# Patient Record
Sex: Female | Born: 1995
Health system: Western US, Academic
[De-identification: ages and names within clinical notes are randomized; demographics above are authoritative.]

## PROBLEM LIST (undated history)

## (undated) DIAGNOSIS — G43909 Migraine, unspecified, not intractable, without status migrainosus: Secondary | ICD-10-CM

---
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
## 2015-11-17 IMAGING — CT CT HEAD W/O CM
2 series · 16 of 30 positions shown, 20 images · non-contrast
Comparison: 04/24/2012

CLINICAL DATA: Initial evaluation for migraine headache for 1 day,
with right eye pain in generalized body numbness

EXAM:
CT HEAD WITHOUT CONTRAST
TECHNIQUE: Contiguous axial images were obtained from the base of the skull
through the vertex without intravenous contrast.

[Series 201: head w/o, idose (1) · axial · non-contrast · 0.49mm/px · z∈[+212,+342]mm · 13 of 32 slices shown, 17 images]
[im 3/32  brain]
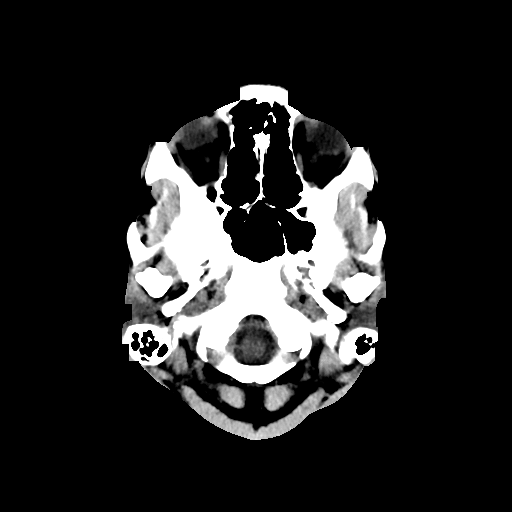
[im 3/32  bone]
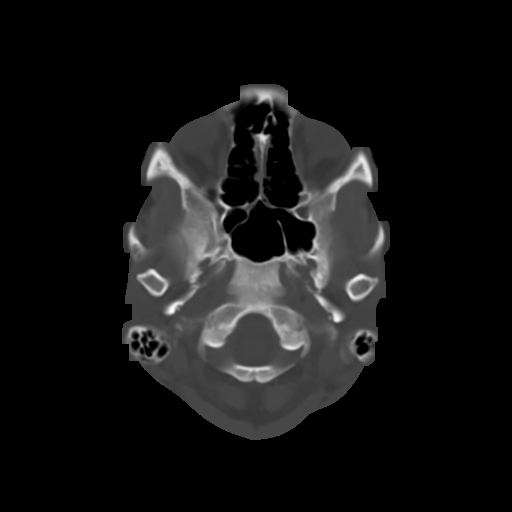
[im 5/32  brain]
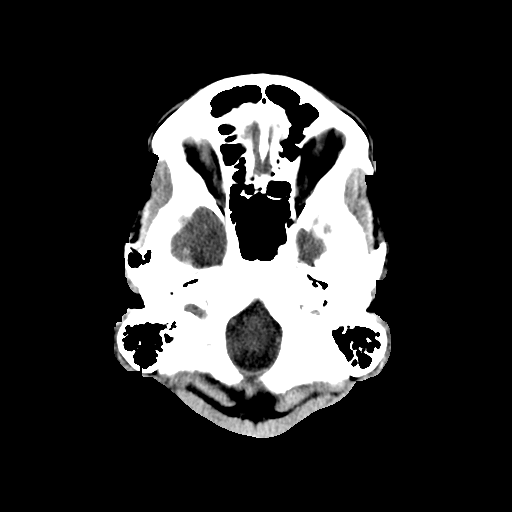
[im 7/32  brain]
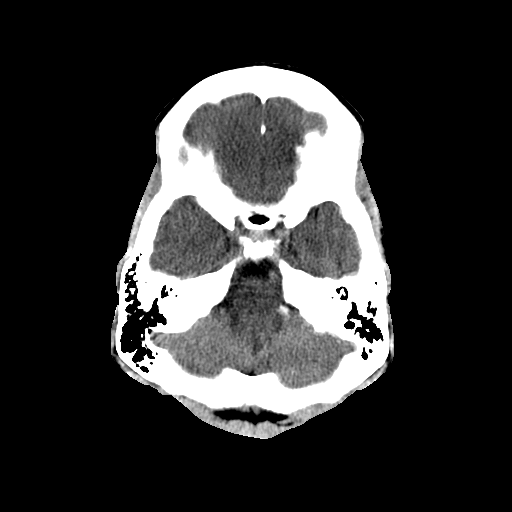
[im 9/32  brain]
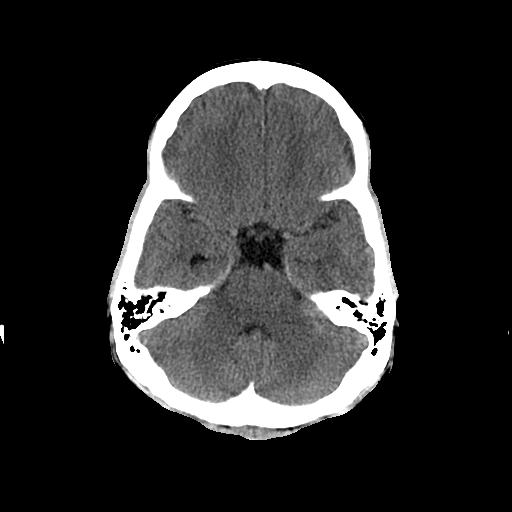
[im 12/32  brain]
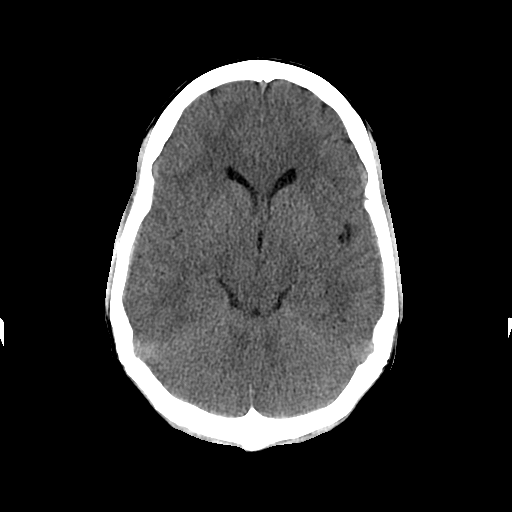
[im 12/32  bone]
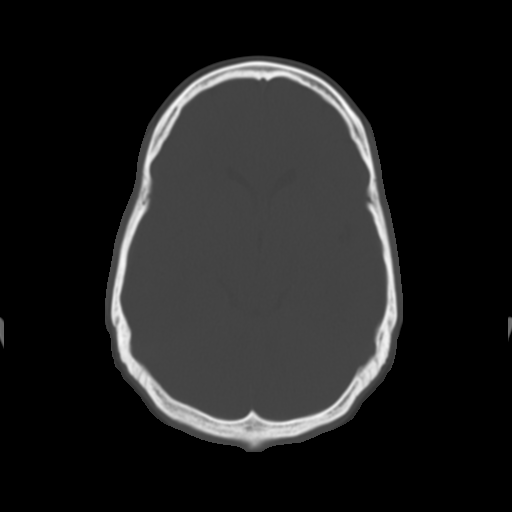
[im 14/32  brain]
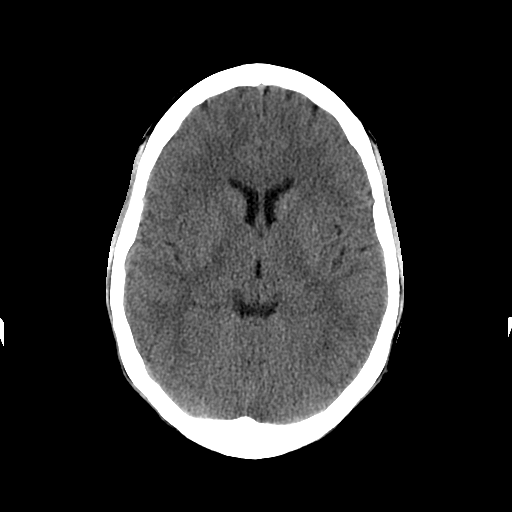
[im 16/32  brain]
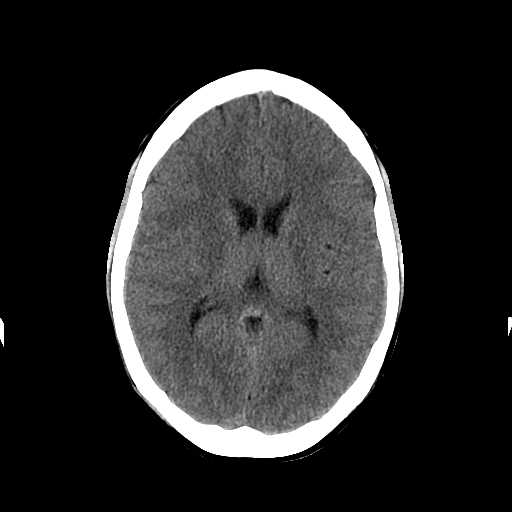
[im 18/32  brain]
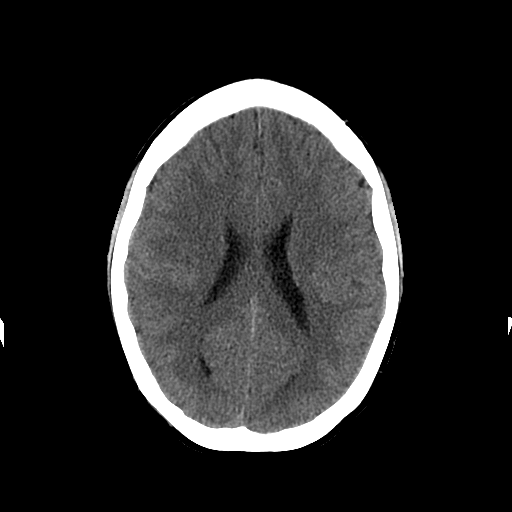
[im 20/32  brain]
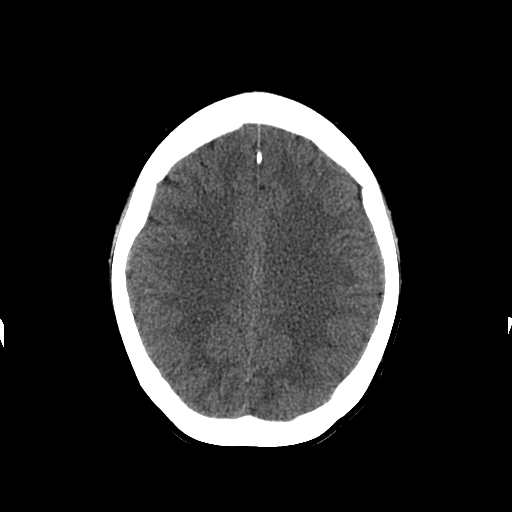
[im 20/32  bone]
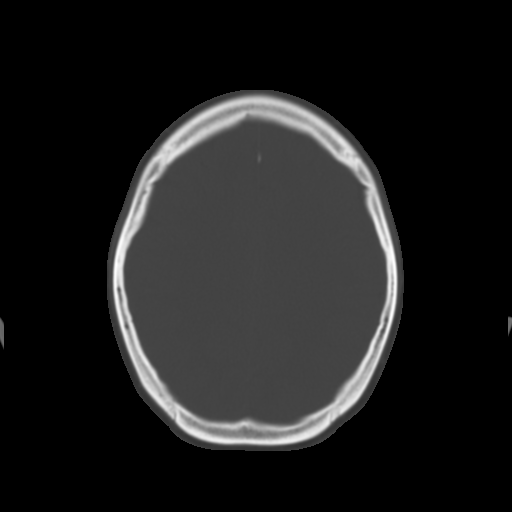
[im 23/32  brain]
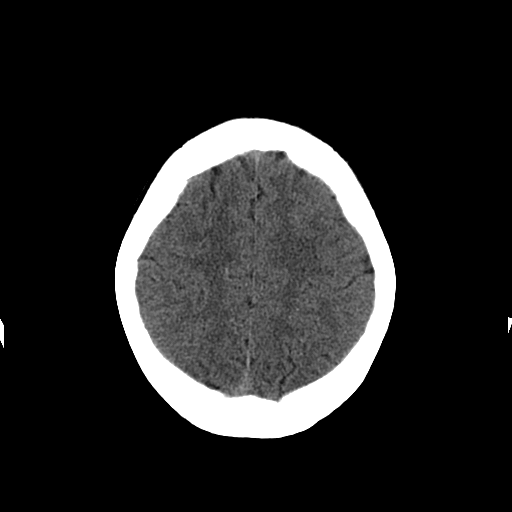
[im 25/32  brain]
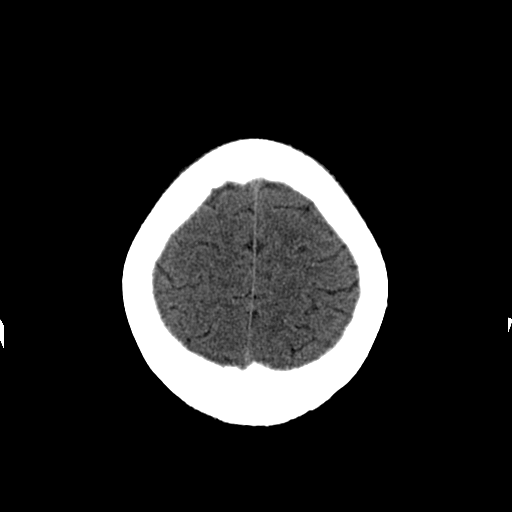
[im 27/32  brain]
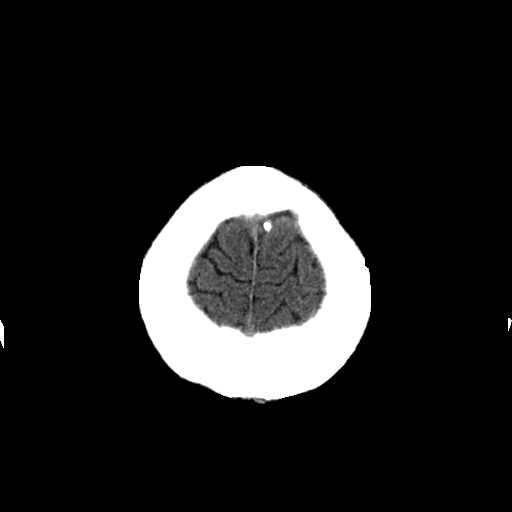
[im 29/32  brain]
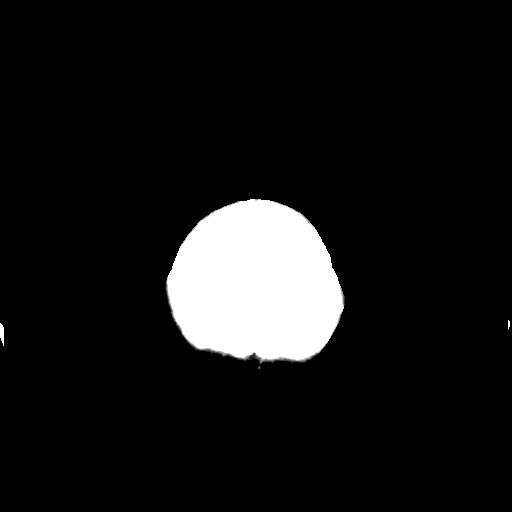
[im 29/32  bone]
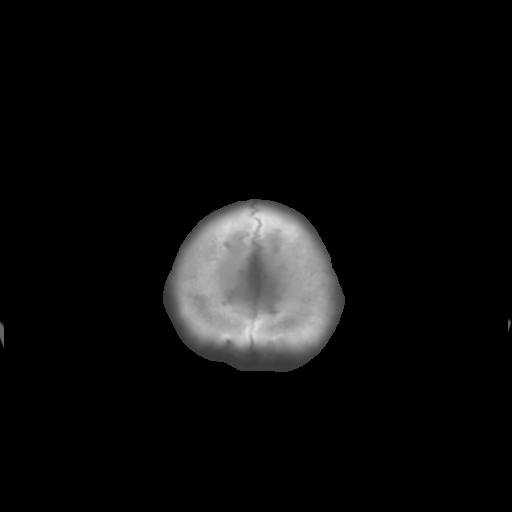

[Series 202: head w/o bone, idose (1) · axial · non-contrast · 0.49mm/px · z∈[+212,+257]mm · 3 of 32 slices shown]
[im 3/32  bone]
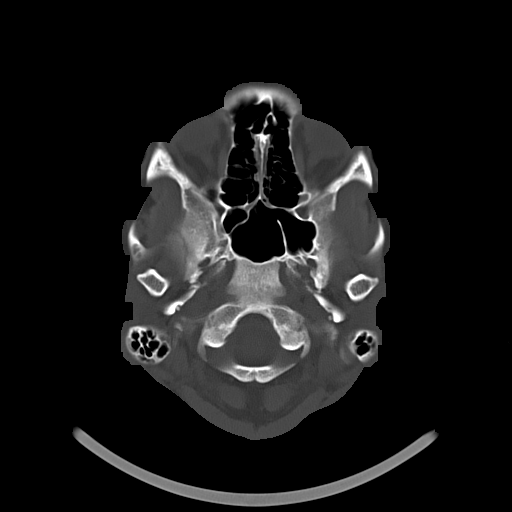
[im 7/32  bone]
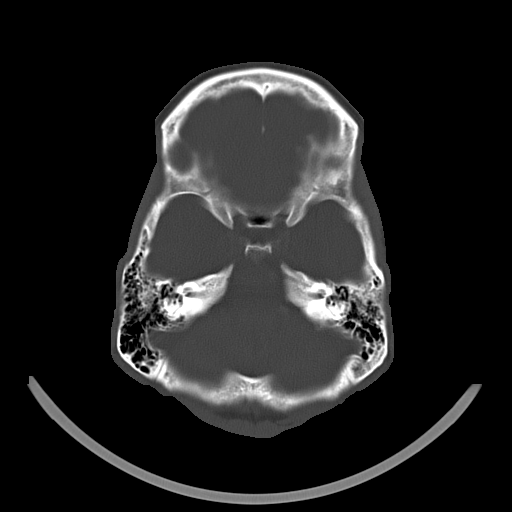
[im 12/32  bone]
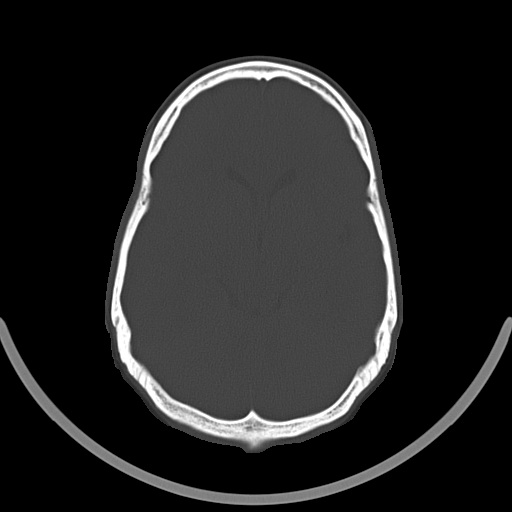

[16 of 30 positions shown; findings below may reference images not displayed]

FINDINGS: No mass lesion. No midline shift. No acute hemorrhage or hematoma.
No extra-axial fluid collections. No evidence of acute infarction.
Calvarium is intact. No significant inflammatory change in the
visualized portions of the paranasal sinuses.
IMPRESSION: Stable and negative head CT.

## 2017-03-03 ENCOUNTER — Encounter (HOSPITAL_COMMUNITY): Payer: Self-pay | Admitting: Emergency Medicine

## 2017-03-03 ENCOUNTER — Ambulatory Visit (HOSPITAL_COMMUNITY)
Admission: EM | Admit: 2017-03-03 | Discharge: 2017-03-03 | Disposition: A | Discharge status: Home / Self Care | Payer: BLUE CROSS/BLUE SHIELD | Attending: Internal Medicine | Admitting: Internal Medicine

## 2017-03-03 DIAGNOSIS — Z79899 Other long term (current) drug therapy: Secondary | ICD-10-CM | POA: Insufficient documentation

## 2017-03-03 DIAGNOSIS — R300 Dysuria: Secondary | ICD-10-CM | POA: Insufficient documentation

## 2017-03-03 LAB — POCT URINALYSIS DIP (DEVICE)
BILIRUBIN URINE: NEGATIVE
GLUCOSE, UA: NEGATIVE mg/dL
LEUKOCYTES UA: NEGATIVE
Nitrite: NEGATIVE
Protein, ur: NEGATIVE mg/dL
SPECIFIC GRAVITY, URINE: 1.025 (ref 1.005–1.030)
Urobilinogen, UA: 0.2 mg/dL (ref 0.0–1.0)
pH: 7 (ref 5.0–8.0)

## 2017-03-03 LAB — POCT PREGNANCY, URINE: Preg Test, Ur: NEGATIVE

## 2017-03-03 MED ORDER — METRONIDAZOLE 500 MG PO TABS: 500.0000 mg | ORAL_TABLET | Freq: Two times a day (BID) | ORAL | 0 refills | Status: DC

## 2017-03-03 MED ORDER — FLUCONAZOLE 150 MG PO TABS: 150.0000 mg | ORAL_TABLET | Freq: Every day | ORAL | 0 refills | Status: DC

## 2017-03-03 NOTE — Discharge Instructions (Signed)
Urine negative for UTI/pregnancy. Will send urine for culture for definite testing. You were treated empirically for bacterial vaginitis. Take flagyl as directed.  Refrain from alcohol use while on Flagyl.  I have also called in Diflucan for yeast infection given antibiotic use.  Cytology sent, you will be contacted with any positive results that requires further treatment. Refrain from sexual activity for the next 7 days. Monitor for any worsening of symptoms, fever, abdominal pain, nausea, vomiting, to follow up for reevaluation.

## 2017-03-03 NOTE — ED Provider Notes (Signed)
[facility]    CSN: [id number] Arrival date & time: 03/03/17  1744     History   Chief Complaint Chief Complaint  Patient presents with  . Urinary Tract Infection    HPI Kim Francis is a 22 y.o. female.   22 year old female comes in for 2-day history of foul urine odor and dysuria.  States she has a change in diet as she has been drinking more caffeine and alcohol. Denies vaginal discharge, itching/pain. She does endorse increased odor to the vaginal area as well.  States the symptoms are similar to when she had BV.  States has had some abdominal cramping that is intermittent after recent Nexplanon implant.  She is also coming off of her cycle, which is the first one in awhile as she had an old nexplanon.  Denies nausea, vomiting.  Denies fever, chills, night sweats. Sexually active with one partner without condom use.      Past Medical History:  Diagnosis Date  . Migraine     There are no active problems to display for this patient.   History reviewed. No pertinent surgical history.  OB History    No data available       Home Medications    Prior to Admission medications   Medication Sig Start Date End Date Taking? Authorizing Provider  fluconazole (DIFLUCAN) 150 MG tablet Take 1 tablet (150 mg total) by mouth daily. Take second dose 72 hours later if symptoms still persists. 03/03/17   Cathie Hoops, Amy V, PA-C  guaifenesin (CHEST CONGESTION RELIEF) 400 MG TABS tablet Take 400 mg by mouth every 6 (six) hours as needed (for congestion).    [provider]  metroNIDAZOLE (FLAGYL) 500 MG tablet Take 1 tablet (500 mg total) by mouth 2 (two) times daily. 03/03/17   Belinda Fisher, PA-C    Family History History reviewed. No pertinent family history.  Social History Social History   Tobacco Use  . Smoking status: Never Smoker  . Smokeless tobacco: Never Used  Substance Use Topics  . Alcohol use: Yes    Comment: occasionally  . Drug use: No      Allergies   Patient has no known allergies.   Review of Systems Review of Systems  Reason unable to perform ROS: See HPI as above.     Physical Exam Triage Vital Signs ED Triage Vitals [03/03/17 1922]  Enc Vitals Group     BP (!) 121/59     Pulse Rate 70     Resp 16     Temp 98.3 F (36.8 C)     Temp Source Oral     SpO2 100 %     Weight      Height      Head Circumference      Peak Flow      Pain Score      Pain Loc      Pain Edu?      Excl. in GC?    No data found.  Updated Vital Signs BP (!) 121/59 (BP Location: Left Arm)   Pulse 70   Temp 98.3 F (36.8 C) (Oral)   Resp 16   LMP 02/24/2017   SpO2 100%   Physical Exam  Constitutional: She is oriented to person, place, and time. She appears well-developed and well-nourished. No distress.  Eyes: Conjunctivae are normal. Pupils are equal, round, and reactive to light.  Cardiovascular: Normal rate, regular rhythm and normal heart sounds. Exam reveals no  gallop and no friction rub.  No murmur heard. Pulmonary/Chest: Effort normal and breath sounds normal. She has no wheezes. She has no rales.  Abdominal: Soft. Bowel sounds are normal. She exhibits no distension. There is no tenderness. There is no rebound, no guarding and no CVA tenderness.  Neurological: She is alert and oriented to person, place, and time.  Skin: Skin is warm and dry.     UC Treatments / Results  Labs (all labs ordered are listed, but only abnormal results are displayed) Labs Reviewed  POCT URINALYSIS DIP (DEVICE) - Abnormal; Notable for the following components:      Result Value   Ketones, ur TRACE (*)    Hgb urine dipstick TRACE (*)    All other components within normal limits  URINE CULTURE  POCT PREGNANCY, URINE  CERVICOVAGINAL ANCILLARY ONLY    EKG  EKG Interpretation None       Radiology No results found.  Procedures Procedures (including critical care time)  Medications Ordered in UC Medications - No data  to display   Initial Impression / Assessment and Plan / UC Course  I have reviewed the triage vital signs and the nursing notes.  Pertinent labs & imaging results that were available during my care of the patient were reviewed by me and considered in my medical decision making (see chart for details).    Urine without leukocytes or nitrates.  Will send for urine culture given patient's symptoms. Patient was treated empirically for BV. Start flagyl as directed.  Referral from alcohol use while on Flagyl.  Patient has a history of yeast infection with antibiotics, Diflucan called in.. Cytology sent, patient will be contacted with any positive results that require additional treatment. Patient to refrain from sexual activity for the next 7 days. Return precautions given.    Final Clinical Impressions(s) / UC Diagnoses   Final diagnoses:  Dysuria    ED Discharge Orders        Ordered    metroNIDAZOLE (FLAGYL) 500 MG tablet  2 times daily     03/03/17 2027    fluconazole (DIFLUCAN) 150 MG tablet  Daily     03/03/17 2027        Belinda FisherYu, Amy V, PA-C 03/03/17 2034

## 2017-03-03 NOTE — ED Triage Notes (Signed)
PT C/O: UTI sx onset 2 days associated w/foul urine odor and dysuria   Sexually active in a monogamous relationship w/no condom use.   No concerns for STDs  DENIES: fevers, vomiting, nausea, abd pain, vag d/c  TAKING MEDS: Azo    A&O x4... NAD... Ambulatory

## 2017-03-05 LAB — URINE CULTURE

## 2017-03-06 LAB — CERVICOVAGINAL ANCILLARY ONLY
Bacterial vaginitis: POSITIVE — AB
Candida vaginitis: NEGATIVE
Chlamydia: NEGATIVE
Neisseria Gonorrhea: NEGATIVE
Trichomonas: NEGATIVE

## 2017-04-26 ENCOUNTER — Ambulatory Visit (HOSPITAL_COMMUNITY)
Admission: EM | Admit: 2017-04-26 | Discharge: 2017-04-26 | Disposition: A | Discharge status: Home / Self Care | Payer: BLUE CROSS/BLUE SHIELD | Attending: Family Medicine | Admitting: Family Medicine

## 2017-04-26 ENCOUNTER — Encounter (HOSPITAL_COMMUNITY): Payer: Self-pay | Admitting: Emergency Medicine

## 2017-04-26 DIAGNOSIS — G43909 Migraine, unspecified, not intractable, without status migrainosus: Secondary | ICD-10-CM | POA: Insufficient documentation

## 2017-04-26 DIAGNOSIS — R300 Dysuria: Secondary | ICD-10-CM | POA: Diagnosis not present

## 2017-04-26 DIAGNOSIS — M549 Dorsalgia, unspecified: Secondary | ICD-10-CM | POA: Diagnosis not present

## 2017-04-26 DIAGNOSIS — Z3202 Encounter for pregnancy test, result negative: Secondary | ICD-10-CM | POA: Diagnosis not present

## 2017-04-26 LAB — POCT URINALYSIS DIP (DEVICE)
BILIRUBIN URINE: NEGATIVE
GLUCOSE, UA: NEGATIVE mg/dL
KETONES UR: NEGATIVE mg/dL
Nitrite: NEGATIVE
Protein, ur: 30 mg/dL — AB
Specific Gravity, Urine: 1.015 (ref 1.005–1.030)
Urobilinogen, UA: 0.2 mg/dL (ref 0.0–1.0)
pH: 7.5 (ref 5.0–8.0)

## 2017-04-26 LAB — POCT PREGNANCY, URINE: Preg Test, Ur: NEGATIVE

## 2017-04-26 MED ORDER — FLUCONAZOLE 150 MG PO TABS: 150.0000 mg | ORAL_TABLET | Freq: Once | ORAL | 0 refills | Status: AC

## 2017-04-26 MED ORDER — NITROFURANTOIN MONOHYD MACRO 100 MG PO CAPS: 100.0000 mg | ORAL_CAPSULE | Freq: Two times a day (BID) | ORAL | 0 refills | Status: AC

## 2017-04-26 NOTE — Discharge Instructions (Signed)
Urine showed evidence of infection. We are treating you with Macrobid. Be sure to take full course. Stay hydrated- urine should be pale yellow to clear. My continue azo for relief of burning while infection is being cleared.  ° °Please return or follow up with your primary provider if symptoms not improving with treatment. Please return sooner if you have worsening of symptoms or develop fever, nausea, vomiting, abdominal pain, back pain, lightheadedness, dizziness. ° °

## 2017-04-26 NOTE — ED Triage Notes (Signed)
Pt c/o dysuria x3 days, now c/o L flank pain. States shes been using azo.

## 2017-04-26 NOTE — ED Provider Notes (Signed)
[facility]    CSN: [id number] Arrival date & time: 04/26/17  1258     History   Chief Complaint Chief Complaint  Patient presents with  . Dysuria    HPI Kim Francis is a 22 y.o. female Patient is presenting with symptoms of dysuria, increased frequency, incomplete voiding, abdominal pressure. Denies hematuria, vaginal discharge. Denies fever, nausea, vomiting, .  Is endorsing some back pain. Patient had spotting last week, patient is on Nexplanon.  Has typical spotting as her menstrual cycle.  Taking Azo.   HPI  Past Medical History:  Diagnosis Date  . Migraine     There are no active problems to display for this patient.   History reviewed. No pertinent surgical history.  OB History   None      Home Medications    Prior to Admission medications   Medication Sig Start Date End Date Taking? Authorizing Provider  Etonogestrel (NEXPLANON [place]) Inject into the skin.   Yes [provider]  fluconazole (DIFLUCAN) 150 MG tablet Take 1 tablet (150 mg total) by mouth once for 1 dose. 04/26/17 04/26/17  Wieters, Hallie C, PA-C  guaifenesin (CHEST CONGESTION RELIEF) 400 MG TABS tablet Take 400 mg by mouth every 6 (six) hours as needed (for congestion).    [provider]  nitrofurantoin, macrocrystal-monohydrate, (MACROBID) 100 MG capsule Take 1 capsule (100 mg total) by mouth 2 (two) times daily for 5 days. 04/26/17 05/01/17  Wieters, Junius CreamerHallie C, PA-C    Family History No family history on file.  Social History Social History   Tobacco Use  . Smoking status: Never Smoker  . Smokeless tobacco: Never Used  Substance Use Topics  . Alcohol use: Yes    Comment: occasionally  . Drug use: No     Allergies   Patient has no known allergies.   Review of Systems Review of Systems  Constitutional: Negative for fever.  Respiratory: Negative for shortness of breath.   Cardiovascular: Negative for chest pain.  Gastrointestinal: Negative for  abdominal pain, diarrhea, nausea and vomiting.  Genitourinary: Positive for dysuria, frequency and vaginal discharge. Negative for genital sores, hematuria, menstrual problem, vaginal bleeding and vaginal pain.  Musculoskeletal: Negative for back pain.  Skin: Negative for rash.  Neurological: Negative for dizziness, light-headedness and headaches.     Physical Exam Triage Vital Signs ED Triage Vitals [04/26/17 1349]  Enc Vitals Group     BP 129/72     Pulse Rate 79     Resp 18     Temp 97.8 F (36.6 C)     Temp src      SpO2 100 %     Weight      Height      Head Circumference      Peak Flow      Pain Score      Pain Loc      Pain Edu?      Excl. in GC?    No data found.  Updated Vital Signs BP 129/72   Pulse 79   Temp 97.8 F (36.6 C)   Resp 18   SpO2 100%   Visual Acuity Right Eye Distance:   Left Eye Distance:   Bilateral Distance:    Right Eye Near:   Left Eye Near:    Bilateral Near:     Physical Exam  Constitutional: She appears well-developed and well-nourished. No distress.  Sitting comfortably on exam table  HENT:  Head: Normocephalic and atraumatic.  Eyes: Conjunctivae are normal.  Neck: Neck supple.  Cardiovascular: Normal rate.  Pulmonary/Chest: Effort normal. No respiratory distress.  Musculoskeletal: She exhibits no edema.  Neurological: She is alert.  Skin: Skin is warm and dry.  Psychiatric: She has a normal mood and affect.  Nursing note and vitals reviewed.    UC Treatments / Results  Labs (all labs ordered are listed, but only abnormal results are displayed) Labs Reviewed  POCT URINALYSIS DIP (DEVICE) - Abnormal; Notable for the following components:      Result Value   Hgb urine dipstick MODERATE (*)    Protein, ur 30 (*)    Leukocytes, UA SMALL (*)    All other components within normal limits  URINE CULTURE  POCT PREGNANCY, URINE  URINE CYTOLOGY ANCILLARY ONLY    EKG None Radiology No results  found.  Procedures Procedures (including critical care time)  Medications Ordered in UC Medications - No data to display   Initial Impression / Assessment and Plan / UC Course  I have reviewed the triage vital signs and the nursing notes.  Pertinent labs & imaging results that were available during my care of the patient were reviewed by me and considered in my medical decision making (see chart for details).     Small leuks on UA, will go ahead and treat with Macrobid.  Urine sent for culture.  Will also send for urine cytology to check for STDs and yeast/BV. Discussed strict return precautions. Patient verbalized understanding and is agreeable with plan.   Final Clinical Impressions(s) / UC Diagnoses   Final diagnoses:  Dysuria    ED Discharge Orders        Ordered    nitrofurantoin, macrocrystal-monohydrate, (MACROBID) 100 MG capsule  2 times daily     04/26/17 1437    fluconazole (DIFLUCAN) 150 MG tablet   Once     04/26/17 1437       Controlled Substance Prescriptions [place] Controlled Substance Registry consulted? Not Applicable   Lew Dawes, [place] 04/26/17 1441

## 2017-04-27 LAB — URINE CYTOLOGY ANCILLARY ONLY
Chlamydia: NEGATIVE
Neisseria Gonorrhea: NEGATIVE
TRICH (WINDOWPATH): NEGATIVE

## 2017-04-28 ENCOUNTER — Telehealth (HOSPITAL_COMMUNITY): Payer: Self-pay

## 2017-04-28 LAB — URINE CULTURE: Culture: >=100000 — AB

## 2017-04-28 LAB — URINE CYTOLOGY ANCILLARY ONLY
BACTERIAL VAGINITIS: NEGATIVE
Candida vaginitis: NEGATIVE

## 2017-04-28 NOTE — Telephone Encounter (Signed)
Pt returned phone call, all education on results given to patient.

## 2017-04-28 NOTE — Telephone Encounter (Signed)
Attempted to contact pt regarding test results from recent visit. Urine culture was positive for UTI and was given correct antibiotic at urgent care visit. Need to educate pt on completing antibiotic and to follow up if symptoms are persistent. Voicemail left.

## 2017-05-01 ENCOUNTER — Telehealth (HOSPITAL_COMMUNITY): Payer: Self-pay

## 2017-05-01 NOTE — Telephone Encounter (Signed)
Pt contacted regarding results from recent visit being within normal range. No answer at this time. Have previously spoke to patient regarding urine culture.

## 2019-02-21 ENCOUNTER — Ambulatory Visit: Payer: Self-pay | Admitting: Dermatology

## 2019-03-05 ENCOUNTER — Ambulatory Visit: Payer: BLUE CROSS/BLUE SHIELD | Attending: Dermatology | Admitting: Dermatology

## 2019-03-05 DIAGNOSIS — L853 Xerosis cutis: Secondary | ICD-10-CM | POA: Insufficient documentation

## 2019-03-05 DIAGNOSIS — L811 Chloasma: Secondary | ICD-10-CM | POA: Insufficient documentation

## 2019-03-05 DIAGNOSIS — L700 Acne vulgaris: Secondary | ICD-10-CM | POA: Insufficient documentation

## 2019-03-05 DIAGNOSIS — L810 Postinflammatory hyperpigmentation: Secondary | ICD-10-CM

## 2019-03-05 DIAGNOSIS — Z872 Personal history of diseases of the skin and subcutaneous tissue: Secondary | ICD-10-CM | POA: Insufficient documentation

## 2019-03-05 DIAGNOSIS — L309 Dermatitis, unspecified: Secondary | ICD-10-CM | POA: Insufficient documentation

## 2019-03-05 DIAGNOSIS — L989 Disorder of the skin and subcutaneous tissue, unspecified: Secondary | ICD-10-CM

## 2019-03-05 DIAGNOSIS — L280 Lichen simplex chronicus: Secondary | ICD-10-CM | POA: Insufficient documentation

## 2019-03-05 DIAGNOSIS — L814 Other melanin hyperpigmentation: Secondary | ICD-10-CM | POA: Insufficient documentation

## 2019-03-05 MED ORDER — TRIAMCINOLONE ACETONIDE 0.1 % TOPICAL OINTMENT
TOPICAL_OINTMENT | TOPICAL | 5 refills | Status: AC
Start: 2019-03-05 — End: 2020-03-04

## 2019-03-05 NOTE — Patient Instructions (Addendum)
Your diagnosis: Acne (on the face)     For your condition, we recommend the following:   - Start over the counter Differin 0.1% gel twice weekly at night for 2 weeks. If tolerable increase to once every other night for 2 weeks. If again tolerable increase to once every night.   - Recommend Neutrogena ultra gentle hydrating creamy face wash twice daily.   - Recommend broad spectrum tinted sunscreen (example includes Super Goop 100% mineral CC cream with SPF 50) daily. https://supergoop.com/products/cc-screen-100-mineral-cc-cream-spf-50   - Discontinue all scrubs, exfoliators, and facials      Example regimen:   Morning:   - Wash face  - Moisturize with moisturizing cream  - Apply sunscreen    Evening:  - Remove makeup   - Wash face  - Apply Differin gel   - Moisturize with moisturizing cream         Your diagnosis: Eczema (behind the knees and elbows)    For your condition, we recommend the following:   - Recommend generous moisturization daily, dry skin care detailed below:   - Start Triamcinolone (KENALOG) 0.1 % Ointment; Apply to affected areas behind the knees and elbows twice daily as needed FOR ITCHING ONLY. Avoid applying to face and skin folds.        Dry Skin Care    Take short, showers (avoid hot water; it takes off more natural oils)  Use a mild soap (eg. Dove, Cetaphil, Neutrogena)  No soap should be used in a bathtub because then soap will end up all     over the body.   Use soap only on areas truly needed (underarms,groin,buttocks,fold areas, feet, face, hair)  Alternatives to soap include Aquanil and Cetaphil cleansers.  Pat off excess water and put moisturizer on immediately (within 3 min.)  I prefer moisturizing creams ( in tub or jar) over lotions.  Good choices include:  1. Cetaphil cream  2. Vanicream  3. Aveeno cream  4. Eucerin cream  5. Aquaphor ointment  A cream should always be applied after showering or bathing, but may be applied as many additional times as is necessary.     Mix  moisturizing cream with small amount of Vaseline and apply to dry areas daily.

## 2019-03-05 NOTE — Nursing Note (Signed)
No vitals signs taken per MD, allergies verified, pharmacy verified.    Patient WAS wearing a surgical mask  universal precautions were followed when caring for the patient.   PPE used by provider during encounter: Surgical mask and Face Shield/Goggles    Tijana Walder, MAII

## 2019-03-05 NOTE — Progress Notes (Signed)
03/05/2019    I had the pleasure of seeing your patient Judy Cooke, a 62yr  female at your request in consultation for the following:    Chief Complaint: The patient's chief concern is "sensitive skin, eczema, acne, rash on the lip".  Please see integrated history, exam, diagnosis, and plan below.     Past Medical History: There is no personal history of skin cancer.    has no past medical history on file.     Allergies: Patient has no known allergies.    Family History: There is not a family history of skin cancer.    Social History: The patient does not smoke or use tobacco products. Reviewed in EMR 03/05/2019.   Occupation: unspecified  Patient recently moved from [place].     Questionnaire dated 03/05/2019 reviewed by me today 03/05/2019.    Review of Systems: The patient has been asked about current: persistent  fever, fatigue, night sweats, significant decrease in energy, unintentional weight loss, weakness, dizziness, mouth sores, bleeding gums, visual changes, persistent cough, wheezing, shortness of breath, sinus problems, chest pain, abdominal pain, nausea, vomiting, diarrhea, joint pain, muscle pain, headaches, pain with urination, heat intolerance, cold intolerance, anxiety, depression, excessive bleeding  Positives are: none. All other ROS are negative.     Physical Examination:    Full skin exam done due to widespread nature of chief complaint.    On physical examination, the patient is in no apparent distress and breathing comfortably. Vital signs were noted. The patient is well nourished and pleasant. The patient is alert to time, place and person. The cutaneous areas of the head and scalp (including palpation), face, neck, chest including the breast, axilla, back, abdomen, right and left upper extremities, buttocks, and right and left lower extremities were examined. The genitalia was not examined. The examination of the above areas are within normal limits, except for any abnormalities are noted  below.    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 This was first noticed several years ago. Patient reports the rash is currently clear. Symptoms include itching. Patient reports itching will proceed the rash. Prior treatments include none. Patient moisturizes with cocoa butter and coconut oil following showers. The patient reports the following modifiers: exacerbated by humidity of the [facility]. No new topicals recalled by patient. She previously used Summer's Eve body wash (due to history of yeast infections), has tried [employer] (with some episodes of yeast infections). No new drugs (including OTC and nutritional supplements) within the months prior to onset of this problem. No new outdoor exposures, pets, or household items. No new detergents or fabric softeners. Patient uses Tide laundry detergent. No close contacts with similar complaints.  Patient reports she has very sensitive skin and would like to know what kind of products to use for her skin.   PE: Lichenified, hyperpigmented patches on the popliteal fossa. Several patches of dry skin located on the trunk and extremities.   Plan:   - Reviewed dry skin care including short, lukewarm baths (<5 minutes/day) and generous application of mild, hypoallergenic emollients in cream form (such as Aveeno, Cetaphil, Eucerin or Aquaphor) within 3 minutes of bathing and several  times throughout the day as tolerated.  Avoid dryer sheets or fabric softeners.  Use ALL Free & Clear detergent.  Mix moisturizing cream with small amount of Vaseline and apply to entire body daily.   - Start Triamcinolone (KENALOG) 0.1 % Ointment;  Apply to affected areas behind the knees and elbows twice daily as needed FOR ITCHING ONLY. Avoid applying to face and skin folds.  Dispense: 80 g; Refill: 5    Dx: Acne vulgaris, comedonal   Dx:  Melasma, Ddx includes pigmentary demarcation line    HPI: Acne located on the cheeks. This was first noticed several years ago. Symptoms include mild tenderness. Patient moisturizes with Neutrogena Hydroboost daily. The patient reports the following modifiers: none. No new topicals recalled by patient. No new drugs (including OTC and nutritional supplements) within the months prior to onset of this problem.   Prior treatments:   - OTC scrubs/exfoliators (favorite brand is Pablo [employer])     PE: Closed monomorphous comedones on the forehead and bilateral cheeks. Few hyperpigmented macules on the cheeks.   Plan:   - Discussed etiology of condition and treatment options extensively with patient including topical treatments and topical Tretinoin.   - Discussed lifestyle modifications including consumption of sugars and dairy products in moderation.  - Strict photoprotection practices reviewed, recommend a broad spectrum tinted sunscreen with iron oxide for visible light and UV protection.   - Recommend OTC Neutrogena Ultra Gentle Hydrating Cream face wash twice daily.  - Can continue moisturizing with Neutrogena Hydroboost   - Start OTC Differin 0.1% gel twice weekly at night for 2 weeks (then gradually increase to once nightly).   - Discontinue all scrubs, exfoliators, and facials - advised patient to refrain from any products that can irritate her skin.   - Recommend focusing on acne treatments prior to pursuing treatments for post-inflammatory hyperpigmentation as active acne can cause additional pigmentary changes. Patient voices understanding. Consider starting Azelaic acid 15% gel when patient returns to clinic.     Dx: Unspecified disorder of skin and subcutaneous tissue, suspect allergic contact dermatitis - resolved     HPI: Hive-like rash located on the upper lip. This was first noticed 3 weeks ago, notes the rash persisted for several days. Rash is currently clear. Patient reports consuming a berry parfait shortly before the flare, notes no prior history of berry allergies. However her mother and sister are allergic to berries. Symptoms include swelling, tingling, itching. The patient reports the following modifiers: none. No new  topicals recalled by patient. No new drugs (including OTC and nutritional supplements) within the months prior to onset of this problem. No new outdoor exposures, pets, or household items. No new detergents or fabric softeners. No close contacts with similar complaints.   Prior treatments:   - OTC oral benadryl with resolution of the rash     PE: Lips clear on exam today.   Plan:   - Advised patient to continue to avoid potential triggers.   - Will continue to monitor.     Photoprotection/skin cancer review:  The nature of sun-induced photo-aging and skin cancers is discussed.  Sun avoidance, protective clothing, and the use of 30-SPF sunscreens is advised. Observe closely for skin damage/changes, and call if such occurs.  Medication Review:   -- I discussed risks, benefits, and proper use of the above dermatological medications with the patient. Patient verbalized understanding of the proper use of dermatological medications and gave verbal consent prior to beginning treatment as outlined above.  Education needs were addressed and there were no barriers to learning.     Handouts: Sunscreen/skin cancer, dry skin care     Follow up: 3-4 months via video or office visit for acne or sooner as needed     PPE documentation:   Patient WAS wearing a surgical mask  - patient removed mask for 5 minutes for duration of visit (for examination).   Droplet precautions were followed when caring for the patient.   PPE used by provider during encounter: Surgical mask, N95 mask, Face Shield/Goggles and Gloves      Reason to opt out of sharing note with patient:   Option 1 : Preventing Harm - Physical harm to patient or others      SCRIBE DISCLAIMER:   I, Siri Cole, a trained medical scribe, scribed this note in the presence of Dr. Charm Barges, MD and Dr. Kathrine Haddock, MD.   Electronically signed by - Siri Cole, 03/05/2019  4:11 PM    PROVIDER DISCLAIMER:  This document serves as my personal record of services that I personally performed and was taken in my presence. It was created on 03/05/2019 on my behalf by the medical scribe noted above.     I have reviewed this document and agree that this note accurately and completely reflects the history and exam findings, the patient care provided, and my medical decision making.    Electronically signed by:        Griffin Basil, MD  Assistant Clinical Professor  [employer]   Department of Dermatology  PI # [id number]

## 2019-06-03 ENCOUNTER — Ambulatory Visit: Payer: Self-pay | Admitting: Dermatology

## 2019-06-11 ENCOUNTER — Ambulatory Visit: Payer: BLUE CROSS/BLUE SHIELD | Admitting: Dermatology
